# Patient Record
Sex: Female | Born: 1975 | Race: White | Hispanic: No | Marital: Single | Smoking: Never smoker
Health system: Southern US, Community
[De-identification: ages and names within clinical notes are randomized; demographics above are authoritative.]

---

## 2000-02-13 ENCOUNTER — Encounter: Admission: RE | Admit: 2000-02-13 | Discharge: 2000-02-13 | Payer: Self-pay

## 2001-09-11 ENCOUNTER — Encounter: Admission: RE | Admit: 2001-09-11 | Discharge: 2001-09-11 | Payer: Self-pay

## 2003-01-13 ENCOUNTER — Other Ambulatory Visit: Admission: RE | Admit: 2003-01-13 | Discharge: 2003-01-13 | Payer: Self-pay | Admitting: Family Medicine

## 2003-12-29 ENCOUNTER — Other Ambulatory Visit: Admission: RE | Admit: 2003-12-29 | Discharge: 2003-12-29 | Payer: Self-pay | Admitting: Family Medicine

## 2005-01-04 ENCOUNTER — Other Ambulatory Visit: Admission: RE | Admit: 2005-01-04 | Discharge: 2005-01-04 | Payer: Self-pay | Admitting: Family Medicine

## 2006-01-08 ENCOUNTER — Other Ambulatory Visit: Admission: RE | Admit: 2006-01-08 | Discharge: 2006-01-08 | Payer: Self-pay | Admitting: Family Medicine

## 2007-02-03 ENCOUNTER — Other Ambulatory Visit: Admission: RE | Admit: 2007-02-03 | Discharge: 2007-02-03 | Payer: Self-pay | Admitting: Family Medicine

## 2008-02-24 ENCOUNTER — Other Ambulatory Visit: Admission: RE | Admit: 2008-02-24 | Discharge: 2008-02-24 | Payer: Self-pay | Admitting: Family Medicine

## 2009-02-25 ENCOUNTER — Other Ambulatory Visit: Admission: RE | Admit: 2009-02-25 | Discharge: 2009-02-25 | Payer: Self-pay | Admitting: Family Medicine

## 2009-05-12 ENCOUNTER — Encounter: Admission: RE | Admit: 2009-05-12 | Discharge: 2009-05-12 | Payer: Self-pay | Admitting: Family Medicine

## 2009-05-19 ENCOUNTER — Encounter: Admission: RE | Admit: 2009-05-19 | Discharge: 2009-05-19 | Payer: Self-pay | Admitting: Family Medicine

## 2010-03-07 ENCOUNTER — Other Ambulatory Visit: Admission: RE | Admit: 2010-03-07 | Discharge: 2010-03-07 | Payer: Self-pay | Admitting: Family Medicine

## 2010-10-24 ENCOUNTER — Emergency Department (HOSPITAL_COMMUNITY)
Admission: EM | Admit: 2010-10-24 | Discharge: 2010-10-24 | Payer: Self-pay | Source: Home / Self Care | Admitting: Emergency Medicine

## 2011-01-08 LAB — POCT PREGNANCY, URINE: Preg Test, Ur: NEGATIVE

## 2011-01-08 LAB — URINALYSIS, ROUTINE W REFLEX MICROSCOPIC
Bilirubin Urine: NEGATIVE
Ketones, ur: NEGATIVE mg/dL
Leukocytes, UA: NEGATIVE
Nitrite: NEGATIVE
Urobilinogen, UA: 0.2 mg/dL (ref 0.0–1.0)
pH: 6 (ref 5.0–8.0)

## 2011-01-08 LAB — BASIC METABOLIC PANEL
CO2: 25 mEq/L (ref 19–32)
Chloride: 104 mEq/L (ref 96–112)
GFR calc Af Amer: >60 mL/min (ref >60)
Potassium: 3.6 mEq/L (ref 3.5–5.1)
Sodium: 136 mEq/L (ref 135–145)

## 2011-01-08 LAB — CBC
HCT: 37.2 % (ref 36.0–46.0)
Hemoglobin: 12.7 g/dL (ref 12.0–15.0)
MCH: 28.5 pg (ref 26.0–34.0)
MCV: 83.4 fL (ref 78.0–100.0)
Platelets: 269 10*3/uL (ref 150–400)
RBC: 4.46 MIL/uL (ref 3.87–5.11)
WBC: 9.3 10*3/uL (ref 4.0–10.5)

## 2011-01-08 LAB — DIFFERENTIAL
Eosinophils Absolute: 0.2 10*3/uL (ref 0.0–0.7)
Eosinophils Relative: 2 % (ref 0–5)
Lymphocytes Relative: 16 % (ref 12–46)
Lymphs Abs: 1.5 10*3/uL (ref 0.7–4.0)
Monocytes Relative: 10 % (ref 3–12)

## 2011-03-12 ENCOUNTER — Other Ambulatory Visit (HOSPITAL_COMMUNITY)
Admission: RE | Admit: 2011-03-12 | Discharge: 2011-03-12 | Disposition: A | Discharge status: Home / Self Care | Payer: BC Managed Care – PPO | Source: Ambulatory Visit | Attending: Family Medicine | Admitting: Family Medicine

## 2011-03-12 ENCOUNTER — Other Ambulatory Visit: Payer: Self-pay | Admitting: Family Medicine

## 2011-03-12 DIAGNOSIS — Z1159 Encounter for screening for other viral diseases: Secondary | ICD-10-CM | POA: Insufficient documentation

## 2011-03-12 DIAGNOSIS — Z124 Encounter for screening for malignant neoplasm of cervix: Secondary | ICD-10-CM | POA: Insufficient documentation

## 2012-04-16 ENCOUNTER — Telehealth: Payer: Self-pay | Admitting: *Deleted

## 2012-04-16 NOTE — Telephone Encounter (Signed)
Left message for pt to return my call so I can schedule a genetic appt.  

## 2012-04-23 ENCOUNTER — Telehealth: Payer: Self-pay | Admitting: *Deleted

## 2012-04-23 NOTE — Telephone Encounter (Signed)
Left message for pt to return my call so I can schedule a genetics appt.

## 2012-04-28 ENCOUNTER — Telehealth: Payer: Self-pay | Admitting: *Deleted

## 2012-04-28 NOTE — Telephone Encounter (Signed)
Left message at home and on cell for pt to return my call to schedule a genetic appt.  Called Bobbi at referring to make her aware that I have left 4 messages for the pt and have been unsuccessful.

## 2012-05-08 ENCOUNTER — Telehealth: Payer: Self-pay | Admitting: Oncology

## 2012-05-08 NOTE — Telephone Encounter (Signed)
x

## 2014-08-09 ENCOUNTER — Telehealth: Payer: Self-pay | Admitting: Hematology and Oncology

## 2014-08-09 ENCOUNTER — Telehealth: Payer: Self-pay | Admitting: Genetic Counselor

## 2014-08-09 NOTE — Telephone Encounter (Signed)
S/W PATIENT AND GAVE GENETIC APPT FOR 11/02 @ 2 [facility] Cl[facility]

## 2014-08-09 NOTE — Telephone Encounter (Signed)
left message for patient to return call to schedule genetic appt °

## 2014-08-30 ENCOUNTER — Ambulatory Visit (HOSPITAL_BASED_OUTPATIENT_CLINIC_OR_DEPARTMENT_OTHER): Payer: BC Managed Care – PPO | Admitting: Genetic Counselor

## 2014-08-30 ENCOUNTER — Other Ambulatory Visit: Payer: BC Managed Care – PPO

## 2014-08-30 ENCOUNTER — Encounter: Payer: Self-pay | Admitting: Genetic Counselor

## 2014-08-30 DIAGNOSIS — Z803 Family history of malignant neoplasm of breast: Secondary | ICD-10-CM

## 2014-08-30 DIAGNOSIS — Z808 Family history of malignant neoplasm of other organs or systems: Secondary | ICD-10-CM

## 2014-08-31 NOTE — Progress Notes (Signed)
Dr.  Nelda Moran, Michele Sorrel, DO requested a consultation for genetic counseling and risk assessment for Michele Moran, a 38 y.o. female, for discussion of her family history of breast cancer and reportedly BRCA mutation in her mother.  She presents to clinic today to discuss the possibility of a genetic predisposition to cancer, and to further clarify her risks, as well as her family members' risks for cancer.   HISTORY OF PRESENT ILLNESS: Michele Moran is a 38 y.o. female with no personal history of cancer.  She has never had a colonoscopy, and had a baseline mammogram at age 54.  History reviewed. No pertinent past medical history.  History reviewed. No pertinent past surgical history.  History   Social History  . Marital Status: Single    Spouse Name: N/A    Number of Children: 0  . Years of Education: N/A   Social History Main Topics  . Smoking status: Never Smoker   . Smokeless tobacco: None  . Alcohol Use: No  . Drug Use: None  . Sexual Activity: None   Other Topics Concern  . None   Social History Narrative  . None    REPRODUCTIVE HISTORY AND PERSONAL RISK ASSESSMENT FACTORS: Menarche was at age 42.   premenopausal Uterus Intact: yes Ovaries Intact: yes [id number], first live birth at age N/A  She has not previously undergone treatment for infertility.   Oral Contraceptive use: 12 years   She has not used HRT in the past.    FAMILY HISTORY:  We obtained a detailed, 4-generation family history.  Significant diagnoses are listed below: Family History  Problem Relation Age of Onset  . BRCA 1/2 Mother     BRCA1+ (by report)  . Breast cancer Maternal Grandmother 60  . ALS Maternal Grandmother 60  . Mesothelioma Maternal Grandfather   . Breast cancer Other     maternal grandfather's sister  . Breast cancer Cousin     mother's paternal first cousin  . Breast cancer Cousin     mother's maternal first cousin - female  Bother of the patient's parents are only  children.  Patient's maternal ancestors are of [place] and Caucasian descent, and paternal ancestors are of Caucasian descent. There is no reported Ashkenazi Jewish ancestry. There is no known consanguinity.  GENETIC COUNSELING ASSESSMENT: Michele Moran is a 38 y.o. female with a family history of breast cancer and possible BRCA mutation which somewhat suggestive of a hereditary breast and ovarian cancer syndrome and predisposition to cancer. We, therefore, discussed and recommended the following at today's visit.   DISCUSSION: We reviewed the characteristics, features and inheritance patterns of hereditary cancer syndromes. We also discussed genetic testing, including the appropriate family members to test, the process of testing, insurance coverage and turn-around-time for results.   The pateint reports that her mother was found to have a BRCA1 mutation, but she does not have access to the report.  The testing was performed, she thinks, prior to 2005 at [facility].  Neither [facility] or [facility] has a record of her mother being tested.  We discussed the option of BRCA testing only, which is most likely what was done prior to 2005, or do additional testing based on risk.  The family history is suggestive of a hereditary cancer syndrome coming from her maternal grandfather's side of the family based on the breast cancer in the family, and a female diagnosed with breast cancer.    Michele Moran decided to  pursue BRCA testing and will try to add on additional testing once results are back.  PLAN: After considering the risks, benefits, and limitations, Michele Moran provided informed consent to pursue genetic testing and the blood sample will be sent to [facility] for analysis of the BRCA 1/2 genes. We discussed the implications of a positive, negative and/ or variant of uncertain significance genetic test result. Results should be available within approximately 2-3 weeks' time, at which  point they will be disclosed by telephone to Michele Moran, as will any additional recommendations warranted by these results. Michele Moran will receive a summary of her genetic counseling visit and a copy of her results once available. This information will also be available in Epic. We encouraged Michele Moran to remain in contact with cancer genetics annually so that we can continuously update the family history and inform her of any changes in cancer genetics and testing that may be of benefit for her family. Michele Moran's questions were answered to her satisfaction today. Our contact information was provided should additional questions or concerns arise.  The patient was seen for a total of 60 minutes, greater than 50% of which was spent face-to-face counseling.  This note will also be sent to the referring provider via the electronic medical record. The patient will be supplied with a summary of this genetic counseling discussion as well as educational information on the discussed hereditary cancer syndromes following the conclusion of their visit.   Patient was discussed with Dr. Marcy Moran.   _______________________________________________________________________ For Office Staff:  Number of people involved in session: 1 Was an Intern/ student involved with case: no

## 2014-09-09 ENCOUNTER — Telehealth: Payer: Self-pay | Admitting: Genetic Counselor

## 2014-09-09 NOTE — Telephone Encounter (Signed)
LM on VM that we had her test results and to please call back.

## 2014-09-15 ENCOUNTER — Encounter: Payer: Self-pay | Admitting: Genetic Counselor

## 2014-09-15 NOTE — Progress Notes (Signed)
HPI: [name] was previously seen in the [place] clinic due to a family history of cancer and concerns regarding a hereditary predisposition to cancer. Specifically, Ms. Michele Moran reported that her mother tested positive for a BRCA mutation, but did not have a report confirming that diagnosis.  Please refer to our prior cancer genetics clinic note for more information regarding [name] medical, social and family histories, and our assessment and recommendations, at the time. [name] recent genetic test results were disclosed to her, as were recommendations warranted by these results. These results and recommendations are discussed in more detail below.  GENETIC TEST RESULTS: At the time of [name] visit, we recommended she pursue genetic testing of the BRCA1 and BRCA1 genes, with the possibility of reflexing to a larger breast cancer panel, as we were not able to confirm the mutation within the fmaily. This test, which included sequencing and deletion/duplication analysis of the following genes:  BRCA1 and BRCA2.  The report date is September 09, 214.  Testing was performed at [facility]. Genetic testing was normal, and did not reveal a deleterious mutation in these genes. The test report has been scanned into EPIC and is located under the Media tab.   We discussed with [name] that since the current genetic testing is not perfect, it is possible there may be a gene mutation in one of these genes that current testing cannot detect, but that chance is small. We offered further testing to determine whether she wanted to pursue further genetic testing.  Ms. Michele Moran will think about this and will recontact [place] if she wants to pursue further testing.  We also discussed, that it is possible that another gene that has not yet been discovered, or that we have not yet tested, is responsible for the cancer diagnoses in the family, and it is, therefore, important to  remain in touch with cancer genetics in the future so that we can continue to offer [name] the most up to date genetic testing.   CANCER SCREENING RECOMMENDATIONS: This result is reassuring and suggests that [name] cancer was most likely not due to an inherited predisposition associated with one of these genes. Most cancers happen by chance and this negative test, along with details of her family history, suggests that her cancer falls into this category. We, therefore, recommended she continue to follow the cancer management and screening guidelines provided by her oncology and primary providers.   RECOMMENDATIONS FOR FAMILY MEMBERS: Women in this family might be at some increased risk of developing cancer, over the general population risk, simply due to the family history of cancer. We recommended women in this family have a yearly mammogram beginning at age 28, an an annual clinical breast exam, and perform monthly breast self-exams. Women in this family should also have a gynecological exam as recommended by their primary provider. All family members should have a colonoscopy by age 63.  FOLLOW-UP: Lastly, we discussed with [name] that cancer genetics is a rapidly advancing field and it is possible that new genetic tests will be appropriate for her and/or her family members in the future. We encouraged her to remain in contact with cancer genetics on an annual basis so we can update her personal and family histories and let her know of advances in cancer genetics that may benefit this family.   Our contact number was provided. [name] questions were answered to her satisfaction, and she knows she is  welcome to call us at anytime with additional questions or concerns.   Roma Kayser, MS, [facility] Certified Genetic Counselor [email or web address]

## 2014-09-30 ENCOUNTER — Encounter: Payer: Self-pay | Admitting: Genetic Counselor

## 2014-09-30 DIAGNOSIS — Z1379 Encounter for other screening for genetic and chromosomal anomalies: Secondary | ICD-10-CM | POA: Insufficient documentation

## 2014-09-30 DIAGNOSIS — Z803 Family history of malignant neoplasm of breast: Secondary | ICD-10-CM | POA: Insufficient documentation

## 2015-07-26 ENCOUNTER — Other Ambulatory Visit: Payer: Self-pay | Admitting: Obstetrics & Gynecology

## 2015-07-26 ENCOUNTER — Other Ambulatory Visit (HOSPITAL_COMMUNITY)
Admission: RE | Admit: 2015-07-26 | Discharge: 2015-07-26 | Disposition: A | Discharge status: Home / Self Care | Payer: BLUE CROSS/BLUE SHIELD | Source: Ambulatory Visit | Attending: Obstetrics & Gynecology | Admitting: Obstetrics & Gynecology

## 2015-07-26 DIAGNOSIS — Z1151 Encounter for screening for human papillomavirus (HPV): Secondary | ICD-10-CM | POA: Insufficient documentation

## 2015-07-26 DIAGNOSIS — Z01419 Encounter for gynecological examination (general) (routine) without abnormal findings: Secondary | ICD-10-CM | POA: Diagnosis present

## 2015-07-27 LAB — CYTOLOGY - PAP

## 2019-11-09 ENCOUNTER — Other Ambulatory Visit: Payer: Self-pay | Admitting: Obstetrics & Gynecology

## 2019-11-09 DIAGNOSIS — Z1231 Encounter for screening mammogram for malignant neoplasm of breast: Secondary | ICD-10-CM

## 2019-12-11 ENCOUNTER — Ambulatory Visit: Payer: BLUE CROSS/BLUE SHIELD

## 2020-01-18 ENCOUNTER — Ambulatory Visit
Admission: RE | Admit: 2020-01-18 | Discharge: 2020-01-18 | Disposition: A | Discharge status: Home / Self Care | Payer: BLUE CROSS/BLUE SHIELD | Source: Ambulatory Visit | Attending: Obstetrics & Gynecology | Admitting: Obstetrics & Gynecology

## 2020-01-18 ENCOUNTER — Other Ambulatory Visit: Payer: Self-pay

## 2020-01-18 DIAGNOSIS — Z1231 Encounter for screening mammogram for malignant neoplasm of breast: Secondary | ICD-10-CM

## 2021-12-08 ENCOUNTER — Other Ambulatory Visit: Payer: Self-pay

## 2021-12-19 ENCOUNTER — Other Ambulatory Visit: Payer: Self-pay | Admitting: Obstetrics & Gynecology

## 2021-12-19 DIAGNOSIS — Z1231 Encounter for screening mammogram for malignant neoplasm of breast: Secondary | ICD-10-CM

## 2022-01-02 ENCOUNTER — Ambulatory Visit
Admission: RE | Admit: 2022-01-02 | Discharge: 2022-01-02 | Disposition: A | Discharge status: Home / Self Care | Payer: No Typology Code available for payment source | Source: Ambulatory Visit | Attending: Obstetrics & Gynecology | Admitting: Obstetrics & Gynecology

## 2022-01-02 DIAGNOSIS — Z1231 Encounter for screening mammogram for malignant neoplasm of breast: Secondary | ICD-10-CM

## 2022-08-19 IMAGING — MG MM DIGITAL SCREENING BILAT W/ TOMO AND CAD
8 series · 9 of 24 positions shown · non-contrast
Comparison: Previous exam(s).

CLINICAL DATA: Screening.

EXAM:
DIGITAL SCREENING BILATERAL MAMMOGRAM WITH TOMOSYNTHESIS AND CAD
TECHNIQUE: Bilateral screening digital craniocaudal and mediolateral oblique
mammograms were obtained. Bilateral screening digital breast
tomosynthesis was performed. The images were evaluated with
computer-aided detection.

[L MLO synth-2D]
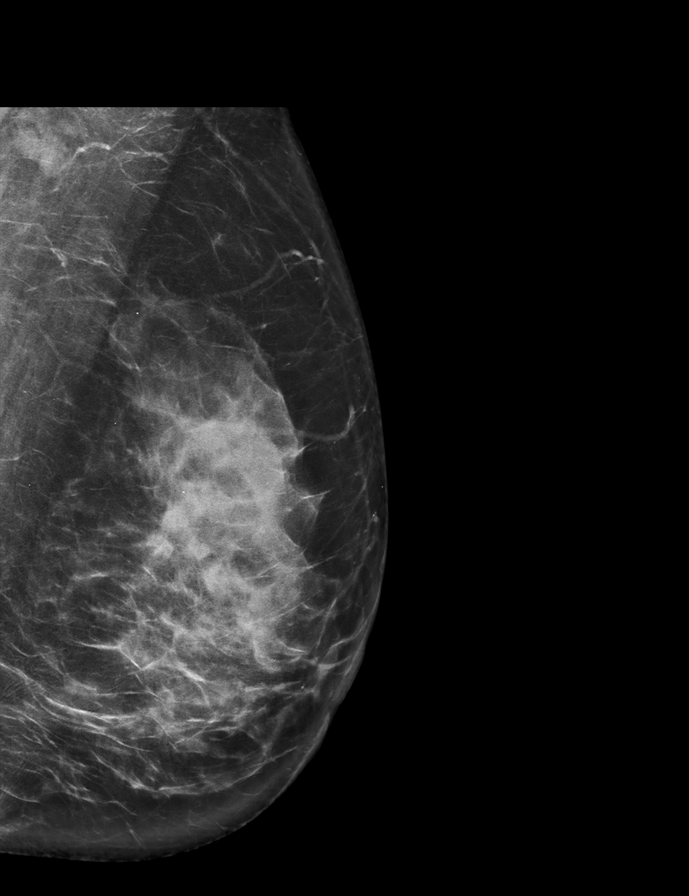

[R CC synth-2D]
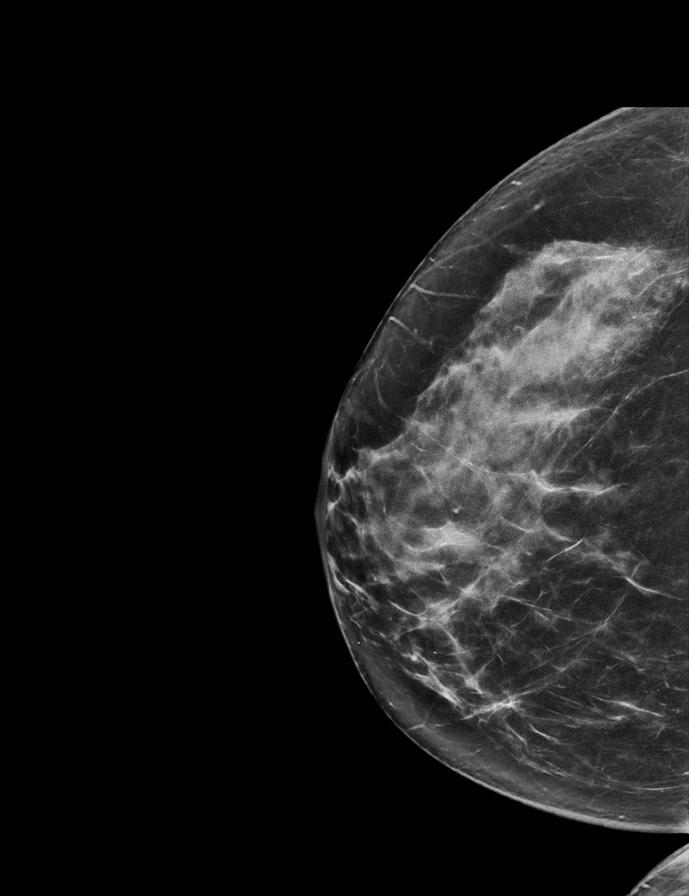

[L CC synth-2D]
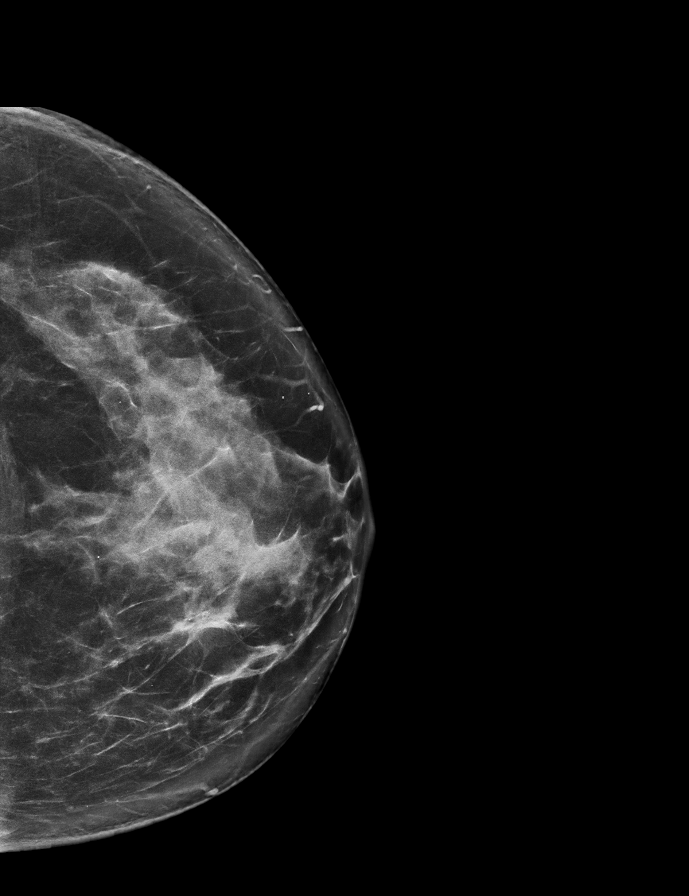

[R MLO synth-2D]
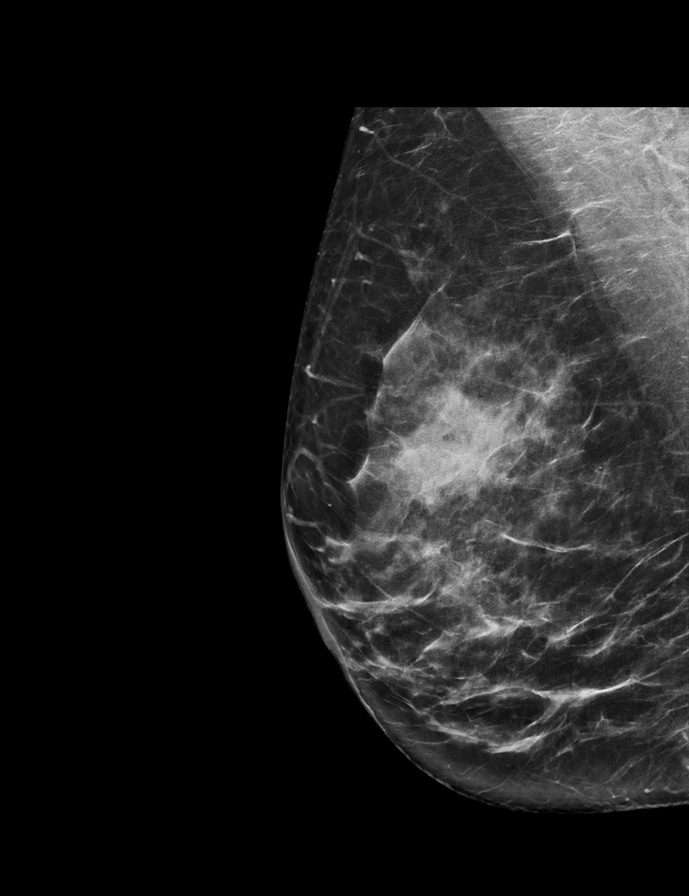

[L MLO tomo · 2 of 83 frames shown]
[frame 27/83]
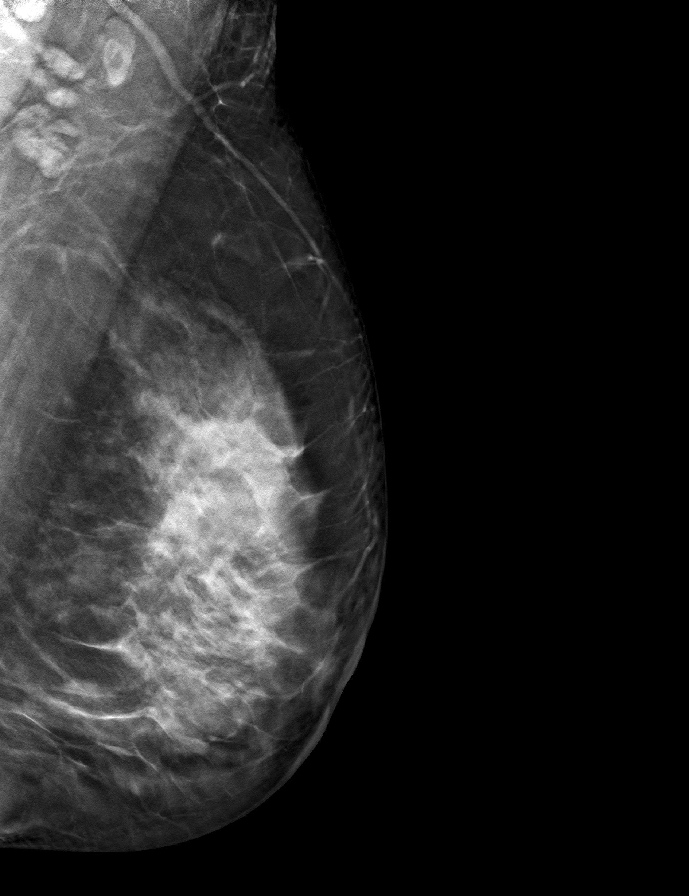
[frame 42/83]
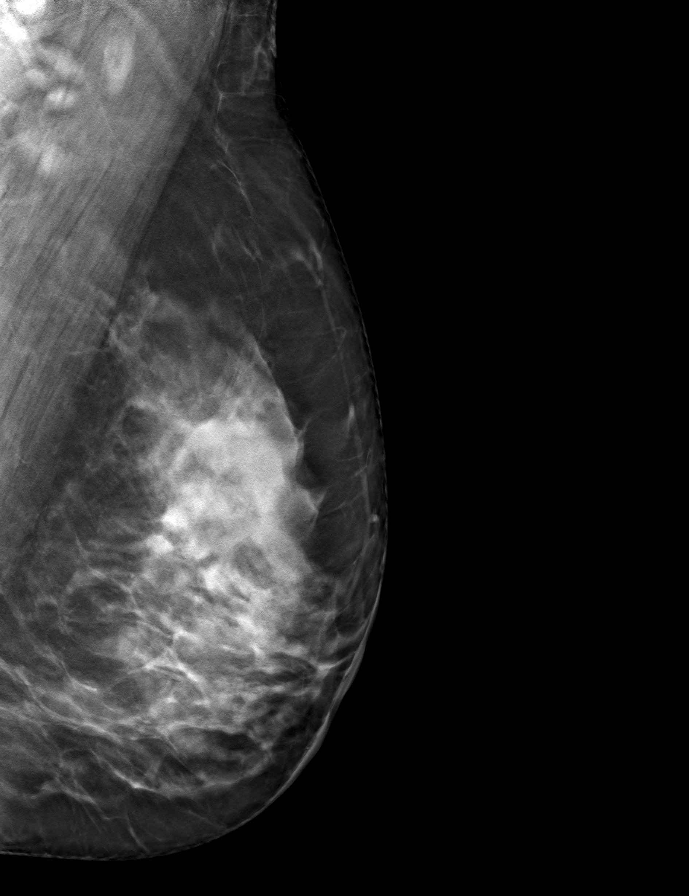

[R CC tomo · tomo slice 38/75.0]
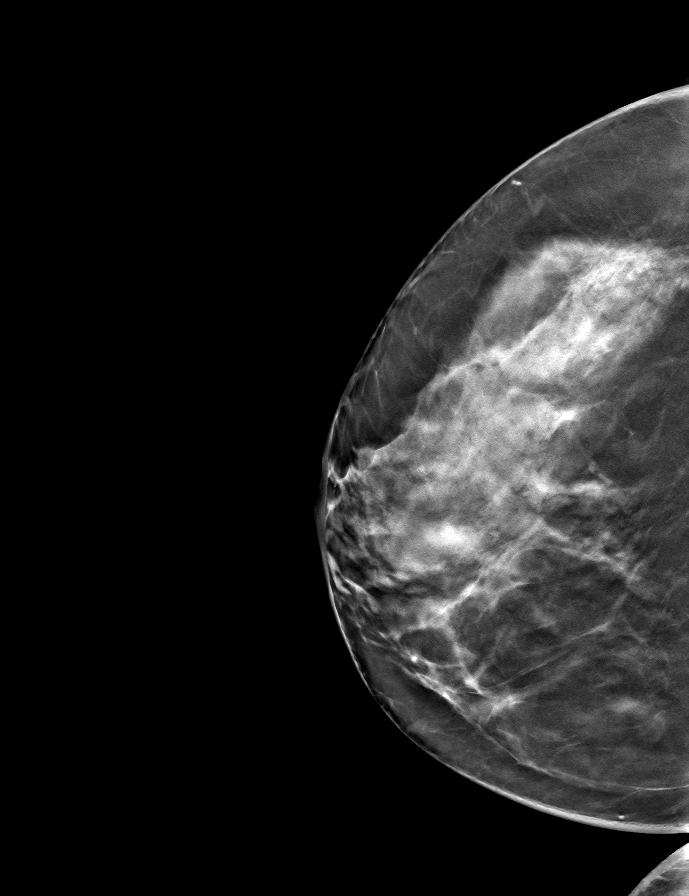

[R MLO tomo · tomo slice 37/74.0]
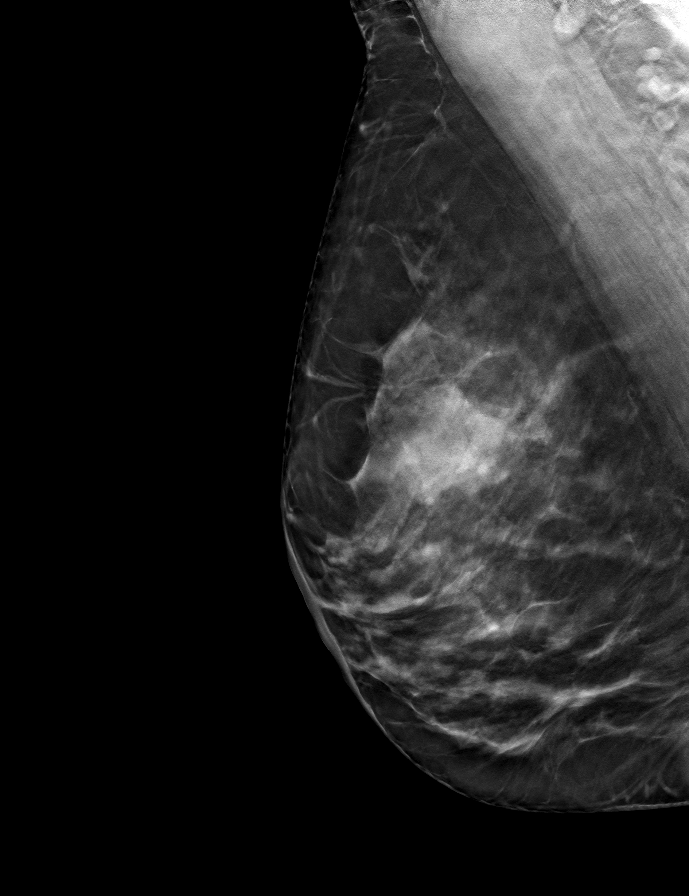

[L CC tomo · tomo slice 41/82.0]
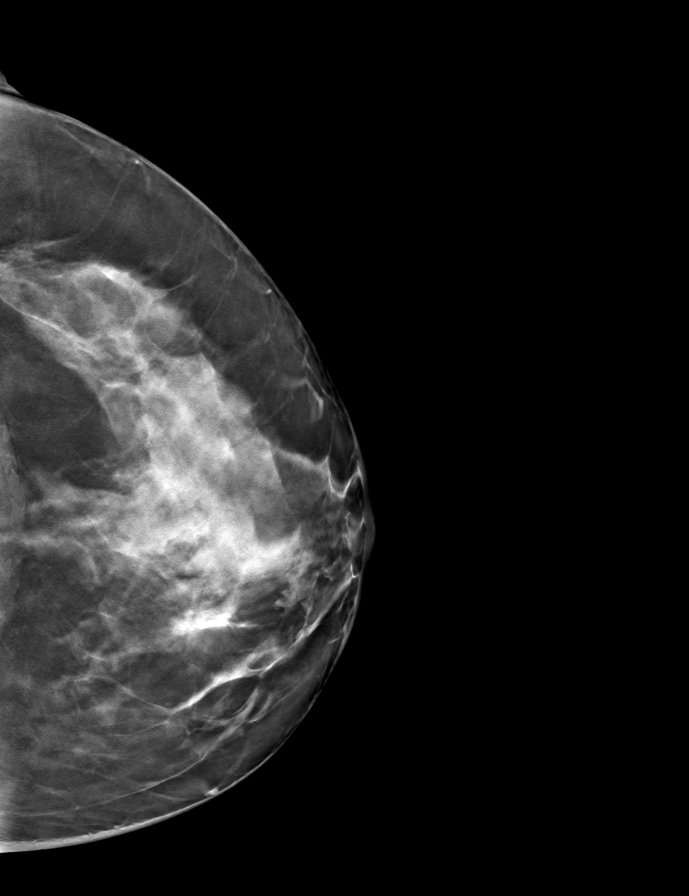

[9 of 24 positions shown; findings below may reference images not displayed]

ACR Breast Density Category c: The breast tissue is heterogeneously
dense, which may obscure small masses.
FINDINGS: There are no findings suspicious for malignancy.
IMPRESSION: No mammographic evidence of malignancy. A result letter of this
screening mammogram will be mailed directly to the patient.

RECOMMENDATION:
Screening mammogram in one year. (Code:Q3-W-BC3)

BI-RADS CATEGORY  1: Negative.

## 2023-02-05 ENCOUNTER — Other Ambulatory Visit: Payer: Self-pay | Admitting: Physician Assistant

## 2023-02-05 DIAGNOSIS — Z1231 Encounter for screening mammogram for malignant neoplasm of breast: Secondary | ICD-10-CM

## 2023-02-12 ENCOUNTER — Other Ambulatory Visit (HOSPITAL_COMMUNITY)
Admission: RE | Admit: 2023-02-12 | Discharge: 2023-02-12 | Disposition: A | Discharge status: Home / Self Care | Payer: No Typology Code available for payment source | Source: Ambulatory Visit | Attending: Nurse Practitioner | Admitting: Nurse Practitioner

## 2023-02-12 ENCOUNTER — Other Ambulatory Visit: Payer: Self-pay | Admitting: Nurse Practitioner

## 2023-02-12 DIAGNOSIS — Z124 Encounter for screening for malignant neoplasm of cervix: Secondary | ICD-10-CM | POA: Insufficient documentation

## 2023-02-14 LAB — CYTOLOGY - PAP
Comment: NEGATIVE
Diagnosis: NEGATIVE
High risk HPV: NEGATIVE

## 2023-03-14 ENCOUNTER — Ambulatory Visit
Admission: RE | Admit: 2023-03-14 | Discharge: 2023-03-14 | Disposition: A | Discharge status: Home / Self Care | Payer: No Typology Code available for payment source | Source: Ambulatory Visit | Attending: Physician Assistant | Admitting: Physician Assistant

## 2023-03-14 DIAGNOSIS — Z1231 Encounter for screening mammogram for malignant neoplasm of breast: Secondary | ICD-10-CM
